# Patient Record
Sex: Female | Born: 1970 | Race: White | Hispanic: Yes | Marital: Married | Smoking: Never smoker
Health system: Southern US, Community
[De-identification: ages and names within clinical notes are randomized; demographics above are authoritative.]

## PROBLEM LIST (undated history)

## (undated) HISTORY — PX: WISDOM TOOTH EXTRACTION: SHX21

## (undated) HISTORY — PX: PARTIAL HYSTERECTOMY: SHX80

## (undated) HISTORY — PX: OTHER SURGICAL HISTORY: SHX169

## (undated) HISTORY — PX: MOUTH SURGERY: SHX715

---

## 2012-06-29 ENCOUNTER — Encounter: Payer: Self-pay | Admitting: Family Medicine

## 2012-06-29 ENCOUNTER — Ambulatory Visit (INDEPENDENT_AMBULATORY_CARE_PROVIDER_SITE_OTHER): Payer: Medicare HMO | Admitting: Family Medicine

## 2012-06-29 VITALS — BP 112/78 | HR 72 | Ht 63.0 in | Wt 140.0 lb

## 2012-06-29 DIAGNOSIS — M545 Low back pain: Secondary | ICD-10-CM

## 2012-06-29 MED ORDER — CYCLOBENZAPRINE HCL 5 MG PO TABS: 5.0000 mg | ORAL_TABLET | Freq: Three times a day (TID) | ORAL | Status: AC | PRN

## 2012-06-29 NOTE — Patient Instructions (Addendum)
Your pain is either due to an irritated nerve in your low back (more likely given your exam) or severe muscle spasms. Take aleve 2 tabs twice a day with food for pain and inflammation. Flexeril as needed for muscle spasms (no driving on this medicine if it makes you sleepy). Stay as active as possible. Physical therapy has been shown to be helpful - start this as well as home exercise program that they show you. Strengthening of low back muscles, abdominal musculature are key for long term pain relief. If not improving, will consider further imaging (x-rays and MRI). Follow up with me in 2 weeks if not improving as expected.  Otherwise follow up with me in 1 month.

## 2012-06-30 ENCOUNTER — Ambulatory Visit: Payer: Self-pay | Admitting: Family Medicine

## 2012-07-01 ENCOUNTER — Ambulatory Visit: Payer: Medicare HMO | Admitting: Physical Therapy

## 2012-07-06 ENCOUNTER — Encounter: Payer: Self-pay | Admitting: Family Medicine

## 2012-07-06 DIAGNOSIS — M545 Low back pain: Secondary | ICD-10-CM | POA: Insufficient documentation

## 2012-07-06 NOTE — Progress Notes (Addendum)
  Subjective:    Patient ID: Deborah Golden, female    DOB: Aug 01, 1971, 41 y.o.   MRN: [id number]  PCP: None  HPI 41 yo F here for low back pain.  Patient denies known injury. States over past 3 weeks has had worsening low back pain. Started in right side low back around the [place] she [place]. After 4-5 days started radiating down right leg. Bending forward worsens pain. Has tried ibuprofen, stretches. No prior back problems. No bowel/bladder dysfunction. Some numbness into right leg.  History reviewed. No pertinent past medical history.  No current outpatient prescriptions on file prior to visit.    History reviewed. No pertinent past surgical history.  No Known Allergies  History   Social History  . Marital Status: Married    Spouse Name: N/A    Number of Children: N/A  . Years of Education: N/A   Occupational History  . Not on file.   Social History Main Topics  . Smoking status: Never Smoker   . Smokeless tobacco: Not on file  . Alcohol Use: Not on file  . Drug Use: Not on file  . Sexually Active: Not on file   Other Topics Concern  . Not on file   Social History Narrative  . No narrative on file    Family History  Problem Relation Age of Onset  . Diabetes Mother   . Heart attack Neg Hx   . Hyperlipidemia Neg Hx   . Hypertension Neg Hx   . Sudden death Neg Hx     BP 112/78  Pulse 72  Ht 5\' 3"  (1.6 m)  Wt 140 lb (63.504 kg)  BMI 24.80 kg/m2  Review of Systems See HPI above.    Objective:   Physical Exam Gen: NAD  Back: No gross deformity, scoliosis. TTP R > L lumbar paraspinal regions.  No midline or bony TTP. FROM with pain on flexion. Strength LEs 5/5 all muscle groups.   2+ MSRs in patellar and achilles tendons, equal bilaterally. Positive SLR right, negative left. Sensation intact to light touch bilaterally. Negative logroll bilateral hips Negative fabers and piriformis stretches.     Assessment &  Plan:  1. Low back pain - most consisrent with lumbar radiculopathy from disc bulge, severe muscle spasms also possible.  Start with aleve, flexeril as needed.  Start formal PT, HEP.  If not improving over next 2 weeks will consider imaging.  Otherwise f/u in 1 month.  Addendum:  Patient would like to try prednisone dose pack - discussed as an option at last visit - will send in to pharmacy downstairs.

## 2012-07-06 NOTE — Assessment & Plan Note (Signed)
most consisrent with lumbar radiculopathy from disc bulge, severe muscle spasms also possible.  Start with aleve, flexeril as needed.  Start formal PT, HEP.  If not improving over next 2 weeks will consider imaging.  Otherwise f/u in 1 month.

## 2012-07-08 MED ORDER — PREDNISONE 10 MG PO TABS: ORAL_TABLET | ORAL | Status: DC

## 2012-07-08 NOTE — Addendum Note (Signed)
Addended by: Lenda Kelp on: 07/08/2012 04:35 PM   Modules accepted: Orders

## 2013-07-01 ENCOUNTER — Other Ambulatory Visit: Payer: Self-pay | Admitting: Internal Medicine

## 2013-07-01 ENCOUNTER — Ambulatory Visit: Payer: Medicare HMO | Admitting: Family Medicine

## 2013-07-01 DIAGNOSIS — R7989 Other specified abnormal findings of blood chemistry: Secondary | ICD-10-CM

## 2013-07-29 ENCOUNTER — Encounter (HOSPITAL_COMMUNITY)
Admission: RE | Admit: 2013-07-29 | Discharge: 2013-07-29 | Disposition: A | Discharge status: Home / Self Care | Payer: Medicare HMO | Source: Ambulatory Visit | Attending: Internal Medicine | Admitting: Internal Medicine

## 2013-07-29 DIAGNOSIS — E0590 Thyrotoxicosis, unspecified without thyrotoxic crisis or storm: Secondary | ICD-10-CM | POA: Insufficient documentation

## 2013-07-29 DIAGNOSIS — R7989 Other specified abnormal findings of blood chemistry: Secondary | ICD-10-CM

## 2013-07-30 ENCOUNTER — Encounter (HOSPITAL_COMMUNITY)
Admission: RE | Admit: 2013-07-30 | Discharge: 2013-07-30 | Disposition: A | Discharge status: Home / Self Care | Payer: Medicare HMO | Source: Ambulatory Visit | Attending: Internal Medicine | Admitting: Internal Medicine

## 2013-07-30 MED ORDER — SODIUM IODIDE I 131 CAPSULE
13.2000 | Freq: Once | INTRAVENOUS | Status: AC | PRN
  Administered 2013-07-30: 13.2 via ORAL

## 2013-07-30 MED ORDER — SODIUM PERTECHNETATE TC 99M INJECTION
10.5000 | Freq: Once | INTRAVENOUS | Status: AC | PRN
  Administered 2013-07-30: 11 via INTRAVENOUS

## 2014-04-27 ENCOUNTER — Encounter: Payer: Self-pay | Admitting: Family Medicine

## 2014-04-27 ENCOUNTER — Ambulatory Visit (INDEPENDENT_AMBULATORY_CARE_PROVIDER_SITE_OTHER): Payer: Medicare HMO | Admitting: Family Medicine

## 2014-04-27 VITALS — BP 105/74 | HR 73 | Ht 63.0 in | Wt 135.0 lb

## 2014-04-27 DIAGNOSIS — M5010 Cervical disc disorder with radiculopathy, unspecified cervical region: Secondary | ICD-10-CM

## 2014-04-27 DIAGNOSIS — M542 Cervicalgia: Secondary | ICD-10-CM

## 2014-04-27 DIAGNOSIS — M5412 Radiculopathy, cervical region: Secondary | ICD-10-CM

## 2014-04-27 MED ORDER — PREDNISONE 10 MG PO TABS: ORAL_TABLET | ORAL | Status: DC

## 2014-04-27 NOTE — Patient Instructions (Signed)
You have cervical radiculopathy (a pinched nerve in the neck). Prednisone 6 day dose pack to relieve irritation/inflammation of the nerve. After you finish the prednisone you can try aleve or ibuprofen. Consider Flexeril three times a day as needed for muscle spasms (can make you sleepy - if so do not drive while taking this). Consider Norco or percocet for severe pain (no driving on this medicine). Consider cervical collar if severely painful. Simple range of motion exercises within limits of pain to prevent further stiffness. Consider physical therapy for stretching, exercises, traction, and modalities. Heat 15 minutes at a time 3-4 times a day to help with spasms. Watch head position when on computers, texting, when sleeping in bed - should in line with back to prevent further nerve traction and irritation. If not improving we will consider an MRI - call me in 1-2 weeks if not improving.

## 2014-04-28 ENCOUNTER — Encounter: Payer: Self-pay | Admitting: Family Medicine

## 2014-04-28 NOTE — Progress Notes (Signed)
Patient ID: Deborah Golden, female   DOB: 1970-11-09, 43 y.o.   MRN: [id number]  PCP: Leanor RubensteinSUN,VYVYAN Y, MD  Subjective:   HPI: Patient is a 43 y.o. female here for Neck pain.  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 Feels like she's been more stressed than normal too. Started to develop left sided neck and shoulder pain with radiation into left hand - first 4th and 5th digits then included 2nd and 3rd digits also. Tried prednisone which helped some but still with tingling and pain. Tried muscle relaxant and pain medications too. Associated spasms in upper back. No prior issues with neck or shoulder.  No past medical history on file.  No current outpatient prescriptions on file prior to visit.   No current facility-administered medications on file prior to visit.    No past surgical history on file.  No Known Allergies  History   Social History  . Marital Status: Married    Spouse Name: N/A    Number of Children: N/A  . Years of Education: N/A   Occupational History  . Not on file.   Social History Main Topics  . Smoking status: Never Smoker   . Smokeless tobacco: Not on file  . Alcohol Use: Not on file  . Drug Use: Not on file  . Sexual Activity: Not on file   Other Topics Concern  . Not on file   Social History Narrative  . No narrative on file    Family History  Problem Relation Age of Onset  . Diabetes Mother   . Heart attack Neg Hx   . Hyperlipidemia Neg Hx   . Hypertension Neg Hx   . Sudden death Neg Hx     BP 105/74  Pulse 73  Ht 5\' 3"  (1.6 m)  Wt 135 lb (61.236 kg)  BMI 23.92 kg/m2  Review of Systems: See HPI above.    Objective:  Physical Exam:  Gen: NAD  Neck: No gross deformity, swelling, bruising.  Spasms left paraspinal cervical region. TTP left paraspinal muscles.  No midline/bony TTP. FROM neck - pain with left lateral rotation, right  lateral rotation, flexion. BUE strength 5/5.   Sensation intact to light touch currently. 2+ equal reflexes in triceps, biceps, brachioradialis tendons. Negative spurlings.  L shoulder: FROM without pain or weakness.    Assessment & Plan:  1. Cervical radiculopathy - suspect bulging disc, maybe underlying DJD as the cause.  Start with prednisone dose pack then transition to aleve or ibuprofen.  Consider PT, flexeril, norco, collar.  Heat for spasms.  Ergonomic issues discussed.  Consider MRI if not improving over the next 1-2 weeks.

## 2014-04-29 ENCOUNTER — Encounter: Payer: Self-pay | Admitting: Family Medicine

## 2014-05-03 DIAGNOSIS — M5010 Cervical disc disorder with radiculopathy, unspecified cervical region: Secondary | ICD-10-CM | POA: Insufficient documentation

## 2014-05-03 NOTE — Assessment & Plan Note (Signed)
suspect bulging disc, maybe underlying DJD as the cause.  Start with prednisone dose pack then transition to aleve or ibuprofen.  Consider PT, flexeril, norco, collar.  Heat for spasms.  Ergonomic issues discussed.  Consider MRI if not improving over the next 1-2 weeks.

## 2014-11-10 ENCOUNTER — Ambulatory Visit (INDEPENDENT_AMBULATORY_CARE_PROVIDER_SITE_OTHER): Payer: Medicare HMO | Admitting: Family Medicine

## 2014-11-10 ENCOUNTER — Encounter: Payer: Self-pay | Admitting: Family Medicine

## 2014-11-10 ENCOUNTER — Encounter (INDEPENDENT_AMBULATORY_CARE_PROVIDER_SITE_OTHER): Payer: Self-pay

## 2014-11-10 VITALS — BP 137/84 | HR 80 | Ht 63.0 in | Wt 145.0 lb

## 2014-11-10 DIAGNOSIS — M5010 Cervical disc disorder with radiculopathy, unspecified cervical region: Secondary | ICD-10-CM

## 2014-11-10 DIAGNOSIS — M542 Cervicalgia: Secondary | ICD-10-CM

## 2014-11-10 MED ORDER — PREDNISONE 10 MG PO TABS: ORAL_TABLET | ORAL | Status: DC

## 2014-11-10 NOTE — Patient Instructions (Signed)
We will go ahead with an MRI of your cervical spine. Consider injection based on these findings. Go ahead with the prednisone dose pack x 6 days. I would avoid overhead work for now.

## 2014-11-15 NOTE — Assessment & Plan Note (Signed)
suspect bulging disc, maybe underlying DJD as the cause.  Had some benefits with prednisone but hasn't completely improved and pain worsening recently.  Gets weakness after a lot of activity.  Discussed options - will repeat prednisone dose pack and go ahead with MRI.  Consider ESI, PT depending on results.

## 2014-11-15 NOTE — Progress Notes (Addendum)
Patient ID: Deborah Golden, female   DOB: Mar 02, 1971, 44 y.o.   MRN: [id number]  PCP: Leanor RubensteinSUN,VYVYAN Y, MD  Subjective:   HPI: Patient is a 44 y.o. female here for Neck pain.  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 Feels like she's been more stressed than normal too. Started to develop left sided neck and shoulder pain with radiation into left hand - first 4th and 5th digits then included 2nd and 3rd digits also. Tried prednisone which helped some but still with tingling and pain. Tried muscle relaxant and pain medications too. Associated spasms in upper back. No prior issues with neck or shoulder.  11/10/14: Patient reports she's continued to struggle with n eck pain. Did get feeling back into fingers of left hand for most part. However she does get weakness into right arm now. Burning sensation into right scapula. Pain also goes from medial to scapula up to neck. Reports prednisone did help after last visit. No bowel/bladder dysfunction. Has been seeing a chiropractor without lasting benefits.  No past medical history on file.  No current outpatient prescriptions on file prior to visit.   No current facility-administered medications on file prior to visit.    No past surgical history on file.  No Known Allergies  History   Social History  . Marital Status: Married    Spouse Name: N/A    Number of Children: N/A  . Years of Education: N/A   Occupational History  . Not on file.   Social History Main Topics  . Smoking status: Never Smoker   . Smokeless tobacco: Not on file  . Alcohol Use: Not on file  . Drug Use: Not on file  . Sexual Activity: Not on file   Other Topics Concern  . Not on file   Social History Narrative    Family History  Problem Relation Age of Onset  . Diabetes Mother   . Heart attack Neg Hx   . Hyperlipidemia Neg Hx   . Hypertension Neg Hx    . Sudden death Neg Hx     BP 137/84 mmHg  Pulse 80  Ht 5\' 3"  (1.6 m)  Wt 145 lb (65.772 kg)  BMI 25.69 kg/m2  Review of Systems: See HPI above.    Objective:  Physical Exam:  Gen: NAD  Neck: No gross deformity, swelling, bruising.  Spasms left paraspinal cervical region. TTP left paraspinal muscles.  No midline/bony TTP. FROM neck - pain with left lateral rotation, right lateral rotation, flexion. BUE strength 5/5.   Sensation intact to light touch currently. 2+ equal reflexes in triceps, biceps, brachioradialis tendons. Negative spurlings.    Assessment & Plan:  1. Cervical radiculopathy - suspect bulging disc, maybe underlying DJD as the cause.  Had some benefits with prednisone but hasn't completely improved and pain worsening recently.  Gets weakness after a lot of activity.  Discussed options - will repeat prednisone dose pack and go ahead with MRI.  Consider ESI, PT depending on results.  Addendum:  MRI reviewed and discussed with patient.  She does have Findings at C5-6 and C6-7 that could account for her pain though C5-6 seems most likely - has disc osteophyte complex with spinal stenosis, moderate right C6 foraminal stenosis.  Also has moderate C7 bilateral foraminal stenosis.  Discussed options and will go ahead with ESI at C5-6.  Call us 1-2 weeks following this for an  update on her status.

## 2014-11-19 ENCOUNTER — Ambulatory Visit (HOSPITAL_BASED_OUTPATIENT_CLINIC_OR_DEPARTMENT_OTHER)
Admission: RE | Admit: 2014-11-19 | Discharge: 2014-11-19 | Disposition: A | Discharge status: Home / Self Care | Payer: Medicare HMO | Source: Ambulatory Visit | Attending: Family Medicine | Admitting: Family Medicine

## 2014-11-19 DIAGNOSIS — M542 Cervicalgia: Secondary | ICD-10-CM

## 2014-11-19 DIAGNOSIS — M4802 Spinal stenosis, cervical region: Secondary | ICD-10-CM | POA: Insufficient documentation

## 2014-11-23 ENCOUNTER — Other Ambulatory Visit: Payer: Self-pay | Admitting: Family Medicine

## 2014-11-23 DIAGNOSIS — M5412 Radiculopathy, cervical region: Secondary | ICD-10-CM

## 2014-11-24 ENCOUNTER — Telehealth: Payer: Self-pay | Admitting: Family Medicine

## 2014-11-24 NOTE — Telephone Encounter (Signed)
Tried to contact patient, however unable to do so. Will try again.

## 2016-01-13 IMAGING — MR MR CERVICAL SPINE W/O CM
5 series · 32 of 48 positions shown · non-contrast
Comparison: None.

CLINICAL DATA: 43-year-old female with neck pain radiating to the
right shoulder and upper extremity for 6 months. Initial encounter.

EXAM:
MRI CERVICAL SPINE WITHOUT CONTRAST
TECHNIQUE: Multiplanar, multisequence MR imaging of the cervical spine was
performed. No intravenous contrast was administered.

[Series 2: T2 · sagittal · 3.0mm · 0.56mm/px · 7 of 15 slices shown (1 of 2)]
[im 1/15]
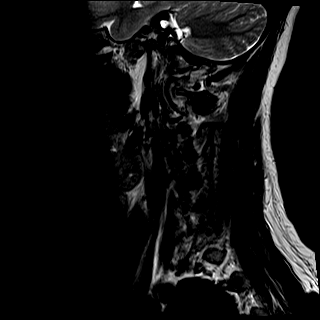
[im 3/15]
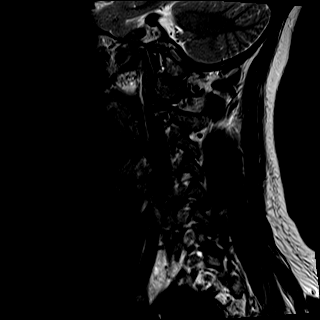
[im 5/15]
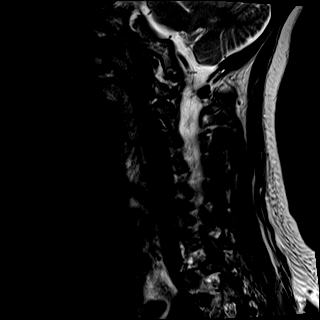
[im 8/15]
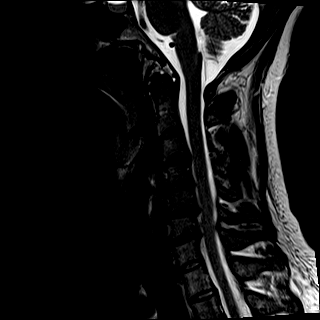
[im 10/15]
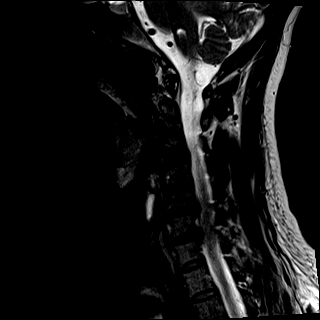
[im 12/15]
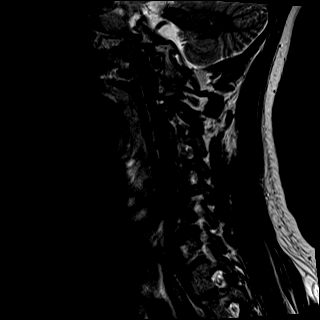
[im 15/15]
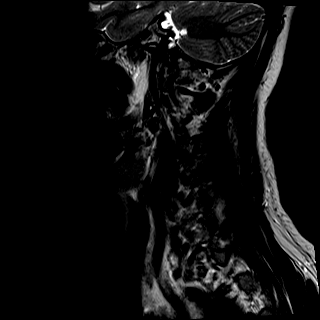

[Series 3: T1 · sagittal · 3.0mm · 0.56mm/px · 7 of 15 slices shown]
[im 1/15]
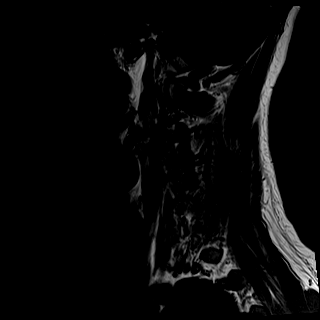
[im 3/15]
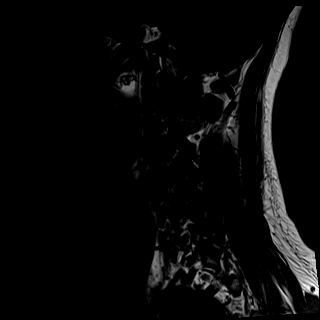
[im 5/15]
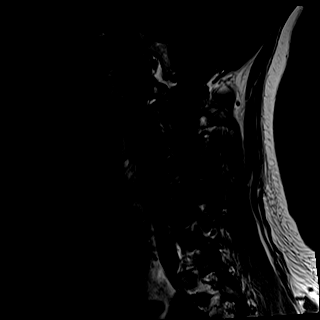
[im 8/15]
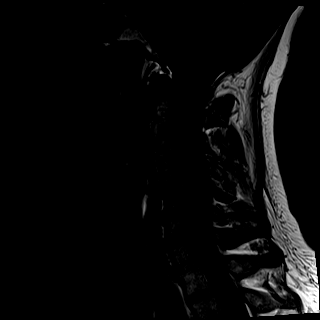
[im 10/15]
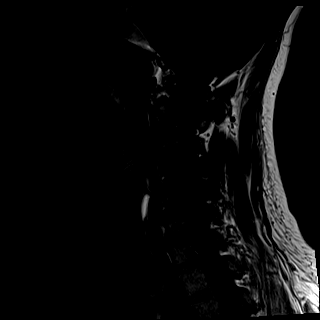
[im 12/15]
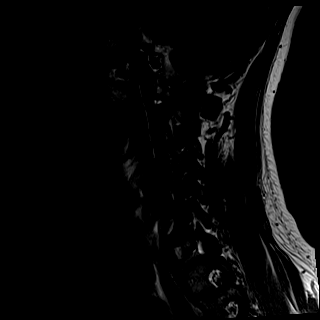
[im 15/15]
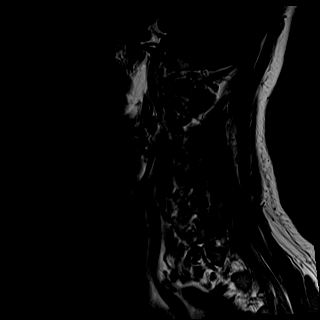

[Series 4: STIR · sagittal · 3.0mm · 0.35mm/px · 8 of 15 slices shown]
[im 1/15]
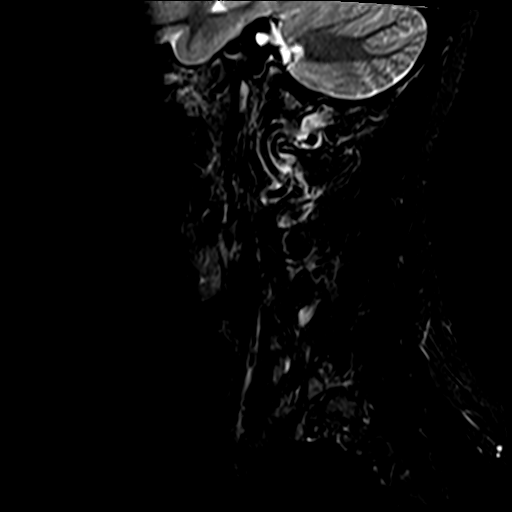
[im 3/15]
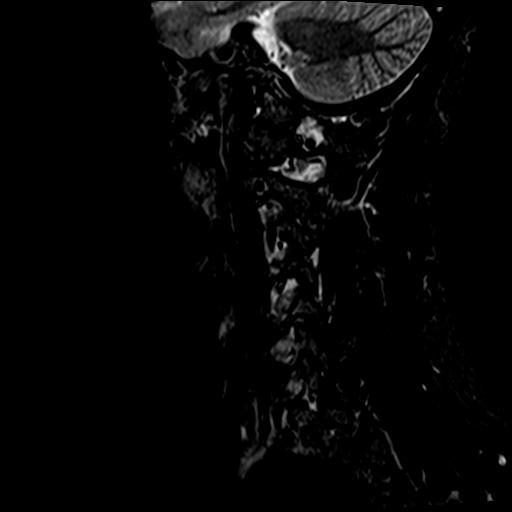
[im 5/15]
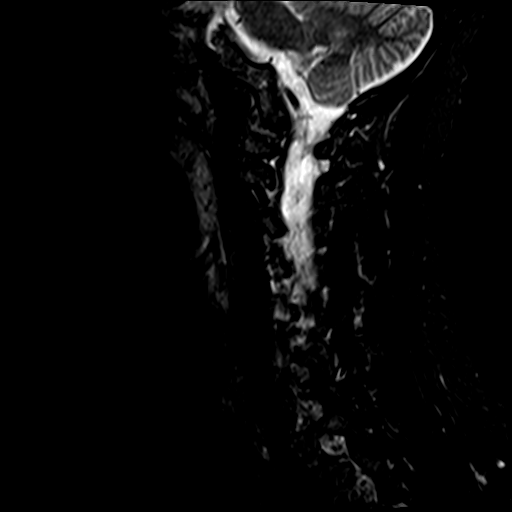
[im 7/15]
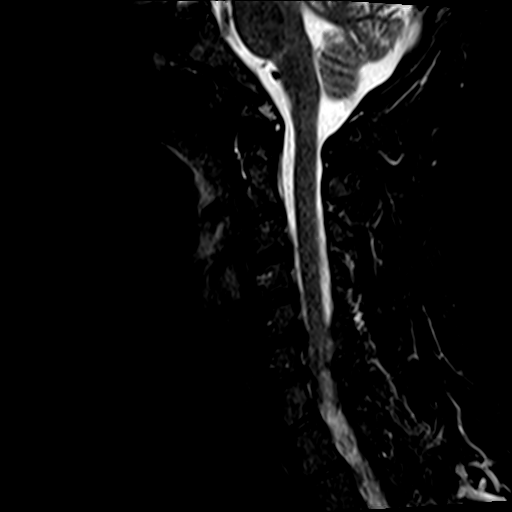
[im 9/15]
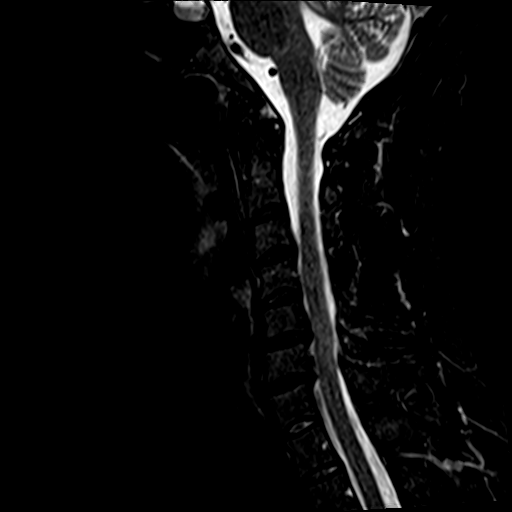
[im 11/15]
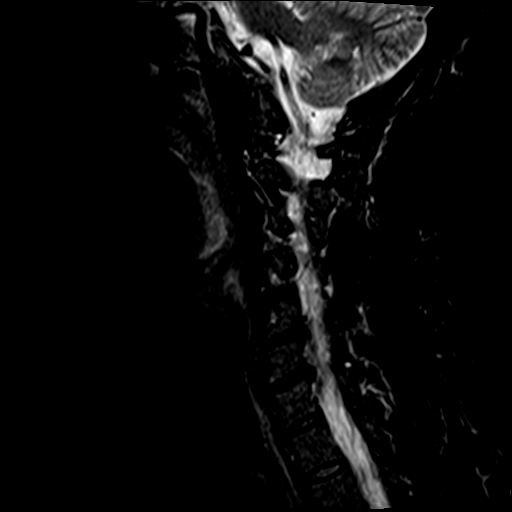
[im 13/15]
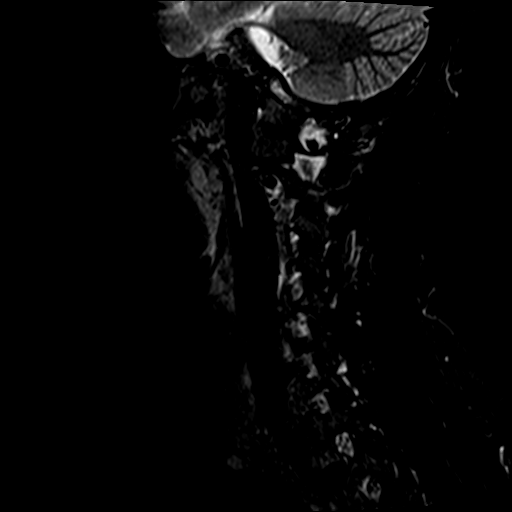
[im 15/15]
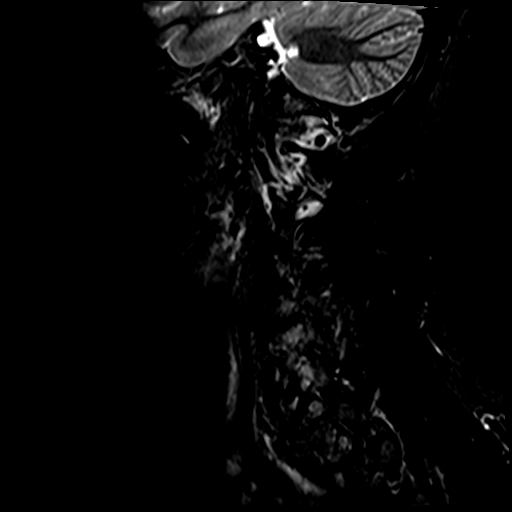

[Series 5: T2 · axial · 3.0mm · 0.62mm/px · z∈[-85,+5]mm · 9 of 25 slices shown (2 of 2)]
[im 1/25]
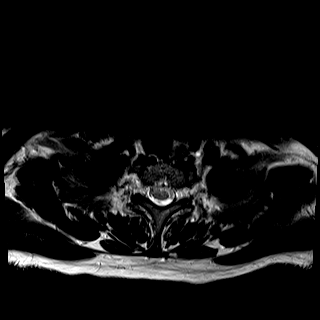
[im 5/25]
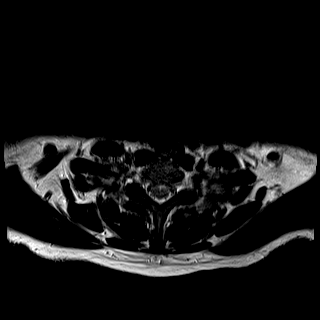
[im 9/25]
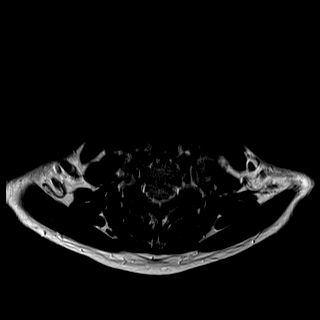
[im 11/25]
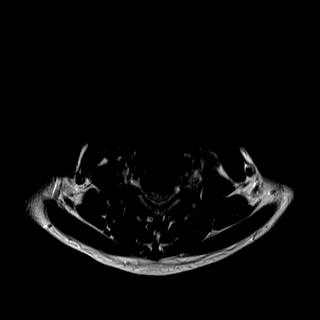
[im 13/25]
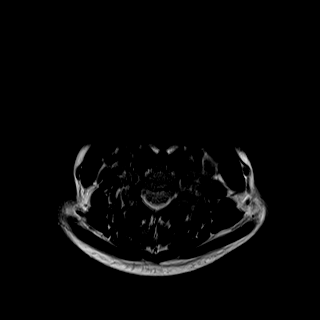
[im 15/25]
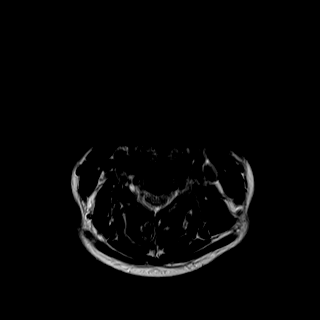
[im 17/25]
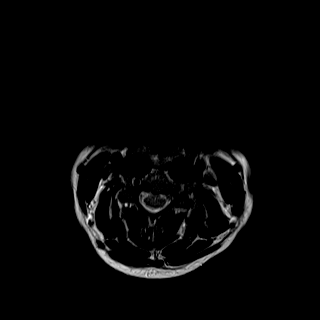
[im 21/25]
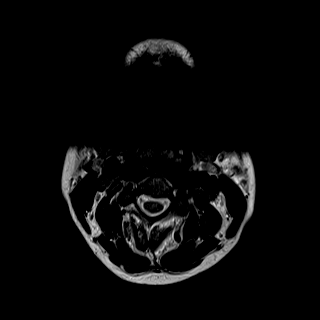
[im 25/25]
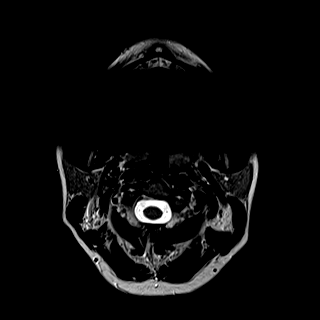

[Series 6: mpgr ax · axial · 3.0mm · 0.35mm/px · 1 of 25 slices shown]
[im 1/25]
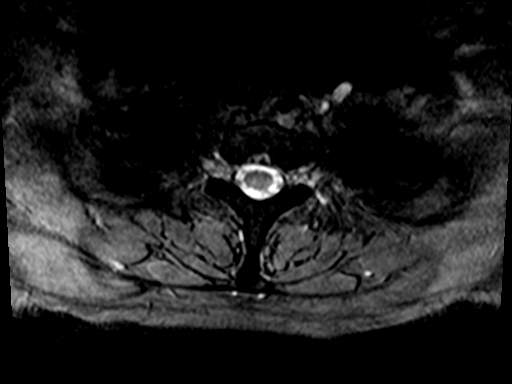

[32 of 48 positions shown; findings below may reference images not displayed]

FINDINGS: Straightening and mild reversal of cervical lordosis. No marrow
edema or evidence of acute osseous abnormality.

Cervicomedullary junction is within normal limits. No spinal cord
signal abnormality identified.

Negative paraspinal soft tissues.

C2-C3:  Negative.

C3-C4: Mild disc bulge and small central disc protrusion. No
significant spinal or foraminal stenosis.

C4-C5: Mild disc bulge in central disc protrusion. Mild left
uncovertebral hypertrophy. No significant stenosis.

C5-C6: Circumferential disc osteophyte complex with broad-based left
eccentric posterior component of disc (series 6, image 15). Spinal
stenosis with mass effect on the left hemi cord. No cord signal
abnormality. Uncovertebral hypertrophy worse on the right. Moderate
right C6 foraminal stenosis.

C6-C7: Circumferential disc osteophyte complex. Broad-based left
eccentric posterior component of disc (series 6, image 19). Mild
spinal stenosis, no definite cord mass effect. Uncovertebral
hypertrophy. Moderate bilateral C7 foraminal stenosis.

C7-T1:  Negative except for mild facet hypertrophy.

No upper thoracic spinal stenosis.
IMPRESSION: 1. Disc and endplate degeneration at C5-C6 and C6-C7 with spinal
stenosis at both levels. Mild spinal cord mass effect at the former
but no cord signal abnormality. Moderate right C6 and bilateral C7
foraminal stenosis.
2. Mild for age cervical spine degeneration elsewhere.

## 2018-03-12 ENCOUNTER — Encounter: Payer: Self-pay | Admitting: Nurse Practitioner

## 2018-03-20 ENCOUNTER — Encounter: Payer: Self-pay | Admitting: Nurse Practitioner

## 2018-03-20 ENCOUNTER — Ambulatory Visit (INDEPENDENT_AMBULATORY_CARE_PROVIDER_SITE_OTHER): Payer: BLUE CROSS/BLUE SHIELD | Admitting: Nurse Practitioner

## 2018-03-20 ENCOUNTER — Encounter (INDEPENDENT_AMBULATORY_CARE_PROVIDER_SITE_OTHER): Payer: Self-pay

## 2018-03-20 VITALS — BP 102/60 | HR 80 | Ht 63.0 in | Wt 149.6 lb

## 2018-03-20 DIAGNOSIS — R1031 Right lower quadrant pain: Secondary | ICD-10-CM

## 2018-03-20 DIAGNOSIS — K921 Melena: Secondary | ICD-10-CM

## 2018-03-20 MED ORDER — PEG 3350-KCL-NABCB-NACL-NASULF 236 G PO SOLR: 4000.0000 mL | Freq: Once | ORAL | 0 refills | Status: AC

## 2018-03-20 NOTE — Progress Notes (Signed)
Chief Complaint: rectal bleeding and abdominal pain  Referring Provider:     Marcelle Overlie, MD   ASSESSMENT AND PLAN;   47 yo female with hematochezia / RLQ pain.  Several episodes of bleeding one day last week, none since persistent crampy RLQ discomfort. She had run and participated in a boot camp the day before onset of symptoms.   Says she stayed well hydrated but ischemic colitis remains on DDx list. The persistent abdominal discomfort , especially in RLL is perplexing as ischemic colitis generally affects left colon -She is not of age for screening colonoscopy yet but given her symptoms I think it is reasonable to proceed with a colonoscopy to evaluated bleeding/pain. The risks and benefits of the procedure were discussed and the patient agrees to proceed.  Patient is requesting Dr. Christella Hartigan.    HPI:    Patient is a 47 year old female referred by GYN for evaluation of hematochezia.  Several days ago patient had several episodes of hematochezia.  She would expel blood even when just going to the bathroom to urinate.  She had associated right lower quadrant cramping.  No bleeding since that one day but she has continued to have RLQ discomfort which is a little worse with eating.   The day prior patient had gone on a run but describes it as a leisure run.  She also recently participated in a boot camp exercise program.  She does not take any hormones, sumatriptan or similar drugs, no decongestants. No constipation.  She has been having problems with abdominal bloating over the last month.  No chronic GI complaints other than occasional loose stool depending on diet.  Patient has episodic use of Advil depending on demands of her physical job.  No nausea, vomiting or weight loss.  Her weight is up about 10 pounds over baseline.   History reviewed. No pertinent past medical history.   Past Surgical History:  Procedure Laterality Date  . MOUTH SURGERY     ?teeth remove"  . PARTIAL  HYSTERECTOMY    . tummy tuck    . WISDOM TOOTH EXTRACTION     Family History  Problem Relation Age of Onset  . Diabetes Mother   . Liver cancer Father   . Prostate cancer Paternal Grandfather   . Colon cancer Paternal Grandfather        questionable  . Heart attack Neg Hx   . Hyperlipidemia Neg Hx   . Hypertension Neg Hx   . Sudden death Neg Hx    Social History   Tobacco Use  . Smoking status: Never Smoker  . Smokeless tobacco: Never Used  Substance Use Topics  . Alcohol use: Yes    Alcohol/week: 1.2 oz    Types: 2 Glasses of wine per week    Frequency: Never  . Drug use: Never   Current Outpatient Medications  Medication Sig Dispense Refill  . valACYclovir (VALTREX) 500 MG tablet Take 500 mg by mouth as needed.   3   No current facility-administered medications for this visit.    No Known Allergies   Review of Systems: Positive for allergies, sinus trouble, and fatigue . All systems reviewed and negative except where noted in HPI.    Physical Exam:    Wt Readings from Last 3 Encounters:  03/20/18 149 lb 9.6 oz (67.9 kg)  11/10/14 145 lb (65.8 kg)  04/27/14 135 lb (61.2 kg)    BP 102/60   Pulse 80   Ht 5'  3" (1.6 m)   Wt 149 lb 9.6 oz (67.9 kg)   BMI 26.50 kg/m  Constitutional:  Well-developed, white  female in no acute distress. Psychiatric: Normal mood and affect. Behavior is normal. EENT: Pupils normal.  Conjunctivae are normal. No scleral icterus. Neck supple.  Cardiovascular: Normal rate, regular rhythm. No edema Pulmonary/chest: Effort normal and breath sounds normal. No wheezing, rales or rhonchi. Abdominal: Soft, nondistended. Nontender. Bowel sounds active throughout. There are no masses palpable. No hepatomegaly. Neurological: Alert and oriented to person place and time. Skin: Skin is warm and dry. No rashes noted.  Willette Cluster, NP  03/20/2018, 8:54 AM  Cc: Marcelle Overlie, MD

## 2018-03-20 NOTE — Patient Instructions (Addendum)
If you are age 46 or older, your body mass index should be between 23-30. Your Body mass index is 26.5 kg/m. If this is out of the aforementioned range listed, please consider follow up with your Primary Care Provider.  If you are age 14 or younger, your body mass index should be between 19-25. Your Body mass index is 26.5 kg/m. If this is out of the aformentioned range listed, please consider follow up with your Primary Care Provider.   You have been scheduled for a colonoscopy. Please follow written instructions given to you at your visit today.  Please pick up your prep supplies at the pharmacy within the next 1-3 days. If you use inhalers (even only as needed), please bring them with you on the day of your procedure. Your physician has requested that you go to www.startemmi.com and enter the access code given to you at your visit today. This web site gives a general overview about your procedure. However, you should still follow specific instructions given to you by our office regarding your preparation for the procedure.  We have sent the following medications to your pharmacy for you to pick up at your convenience: Golytely  Thank you for choosing me and [facility].   Willette Cluster, NP

## 2018-03-24 NOTE — Progress Notes (Signed)
I agaree with the above note, plan 

## 2018-04-15 ENCOUNTER — Encounter: Payer: Self-pay | Admitting: Gastroenterology

## 2018-04-28 ENCOUNTER — Encounter: Payer: Self-pay | Admitting: Gastroenterology

## 2018-04-28 ENCOUNTER — Other Ambulatory Visit: Payer: Self-pay

## 2018-04-28 ENCOUNTER — Ambulatory Visit (AMBULATORY_SURGERY_CENTER): Payer: BLUE CROSS/BLUE SHIELD | Admitting: Gastroenterology

## 2018-04-28 VITALS — BP 113/46 | HR 66 | Temp 98.9°F | Resp 15 | Ht 63.0 in | Wt 149.0 lb

## 2018-04-28 DIAGNOSIS — K921 Melena: Secondary | ICD-10-CM

## 2018-04-28 DIAGNOSIS — K5730 Diverticulosis of large intestine without perforation or abscess without bleeding: Secondary | ICD-10-CM | POA: Diagnosis not present

## 2018-04-28 MED ORDER — SODIUM CHLORIDE 0.9 % IV SOLN: 500.0000 mL | Freq: Once | INTRAVENOUS | Status: AC

## 2018-04-28 NOTE — Patient Instructions (Signed)
diverticulosis seen today. Handout given on diverticulosis. Repeat colonoscopy in 10 years.  Resume current medications. Call us with any questions or concerns. Thank you!  YOU HAD AN ENDOSCOPIC PROCEDURE TODAY AT THE [facility]:   Refer to the procedure report that was given to you for any specific questions about what was found during the examination.  If the procedure report does not answer your questions, please call your gastroenterologist to clarify.  If you requested that your care partner not be given the details of your procedure findings, then the procedure report has been included in a sealed envelope for you to review at your convenience later.  YOU SHOULD EXPECT: Some feelings of bloating in the abdomen. Passage of more gas than usual.  Walking can help get rid of the air that was put into your GI tract during the procedure and reduce the bloating. If you had a lower endoscopy (such as a colonoscopy or flexible sigmoidoscopy) you may notice spotting of blood in your stool or on the toilet paper. If you underwent a bowel prep for your procedure, you may not have a normal bowel movement for a few days.  Please Note:  You might notice some irritation and congestion in your nose or some drainage.  This is from the oxygen used during your procedure.  There is no need for concern and it should clear up in a day or so.  SYMPTOMS TO REPORT IMMEDIATELY:   Following lower endoscopy (colonoscopy or flexible sigmoidoscopy):  Excessive amounts of blood in the stool  Significant tenderness or worsening of abdominal pains  Swelling of the abdomen that is new, acute  Fever of 100F or higher  For urgent or emergent issues, a gastroenterologist can be reached at any hour by calling [phone].   DIET:  We do recommend a small meal at first, but then you may proceed to your regular diet.  Drink plenty of fluids but you should avoid alcoholic beverages for 24 hours.  ACTIVITY:  You  should plan to take it easy for the rest of today and you should NOT DRIVE or use heavy machinery until tomorrow (because of the sedation medicines used during the test).    FOLLOW UP: Our staff will call the number listed on your records the next business day following your procedure to check on you and address any questions or concerns that you may have regarding the information given to you following your procedure. If we do not reach you, we will leave a message.  However, if you are feeling well and you are not experiencing any problems, there is no need to return our call.  We will assume that you have returned to your regular daily activities without incident.  If any biopsies were taken you will be contacted by phone or by letter within the next 1-3 weeks.  Please call us at [phone] if you have not heard about the biopsies in 3 weeks.    SIGNATURES/CONFIDENTIALITY: You and/or your care partner have signed paperwork which will be entered into your electronic medical record.  These signatures attest to the fact that that the information above on your After Visit Summary has been reviewed and is understood.  Full responsibility of the confidentiality of this discharge information lies with you and/or your care-partner.

## 2018-04-28 NOTE — Progress Notes (Signed)
A and O x3. Report to RN. Tolerated MAC anesthesia well.

## 2018-04-28 NOTE — Op Note (Signed)
[facility] Patient Name: Deborah Golden Procedure Date: 04/28/2018 2:16 PM MRN: [id number] Endoscopist: Rachael Fee , MD Age: 47 Referring MD:  Date of Birth: 1970-11-23 Gender: Female Account #: [id number] Procedure:                Colonoscopy Indications:              Abdominal pain in the right lower quadrant,                            Hematochezia; both have resolved Medicines:                Monitored Anesthesia Care Procedure:                Pre-Anesthesia Assessment:                           - Prior to the procedure, a History and Physical                            was performed, and patient medications and                            allergies were reviewed. The patient's tolerance of                            previous anesthesia was also reviewed. The risks                            and benefits of the procedure and the sedation                            options and risks were discussed with the patient.                            All questions were answered, and informed consent                            was obtained. Prior Anticoagulants: The patient has                            taken no previous anticoagulant or antiplatelet                            agents. ASA Grade Assessment: II - A patient with                            mild systemic disease. After reviewing the risks                            and benefits, the patient was deemed in                            satisfactory condition to undergo the procedure.  After obtaining informed consent, the colonoscope                            was passed under direct vision. Throughout the                            procedure, the patient's blood pressure, pulse, and                            oxygen saturations were monitored continuously. The                            Colonoscope was introduced through the anus and                            advanced to the the terminal  ileum. The colonoscopy                            was performed without difficulty. The patient                            tolerated the procedure well. The quality of the                            bowel preparation was good. The terminal ileum,                            ileocecal valve, appendiceal orifice, and rectum                            were photographed. Scope In: 2:25:17 PM Scope Out: 2:37:01 PM Scope Withdrawal Time: 0 hours 8 minutes 10 seconds  Total Procedure Duration: 0 hours 11 minutes 44 seconds  Findings:                 Multiple small and large-mouthed diverticula were                            found in the left colon.                           The exam was otherwise without abnormality on                            direct and retroflexion views. Complications:            No immediate complications. Estimated blood loss:                            None. Estimated Blood Loss:     Estimated blood loss: none. Impression:               - Diverticulosis in the left colon.                           - The examination was otherwise normal on direct  and retroflexion views.                           - Your RLQ pain and bleeding were almost certainly                            related to mild 'ischemic colitis.' Significant                            excercise, possible dehydration the day prior to                            your symptoms probably played a role. No need for                            further testing unless your sympoms recur. Recommendation:           - Patient has a contact number available for                            emergencies. The signs and symptoms of potential                            delayed complications were discussed with the                            patient. Return to normal activities tomorrow.                            Written discharge instructions were provided to the                            patient.                            - Resume previous diet.                           - Continue present medications.                           - Repeat colonoscopy in 10 years for screening                            purposes. There is no need for colon cancer                            screening by any method (including stool testing)                            prior to then. Rachael Fee, MD 04/28/2018 2:41:46 PM This report has been signed electronically.

## 2018-04-29 ENCOUNTER — Telehealth: Payer: Self-pay

## 2018-04-29 NOTE — Telephone Encounter (Signed)
Left message, will call back later today, B.Sherrill Mckamie RN 

## 2018-04-29 NOTE — Telephone Encounter (Signed)
  Follow up Call-  Call back number 04/28/2018  Post procedure Call Back phone  # [phone]  Permission to leave phone message Yes  Some recent data might be hidden     Patient questions:  Do you have a fever, pain , or abdominal swelling? No. Pain Score  0 *  Have you tolerated food without any problems? Yes.    Have you been able to return to your normal activities? Yes.    Do you have any questions about your discharge instructions: Diet   No. Medications  No. Follow up visit  No.  Do you have questions or concerns about your Care? No.  Actions: * If pain score is 4 or above: No action needed, pain <4.

## 2020-02-03 ENCOUNTER — Ambulatory Visit: Payer: BC Managed Care – PPO | Attending: Internal Medicine

## 2020-02-03 DIAGNOSIS — Z23 Encounter for immunization: Secondary | ICD-10-CM

## 2020-02-03 NOTE — Progress Notes (Signed)
   Covid-19 Vaccination Clinic  Name:  Morganna Styles    MRN: [id number] DOB: 11-09-1970  02/03/2020  Ms. Ketterman was observed post Covid-19 immunization for 15 minutes without incident. She was provided with Vaccine Information Sheet and instruction to access the V-Safe system.   Ms. Kantor was instructed to call 911 with any severe reactions post vaccine: Marland Kitchen Difficulty breathing  . Swelling of face and throat  . A fast heartbeat  . A bad rash all over body  . Dizziness and weakness   Immunizations Administered    Name Date Dose VIS Date Route   Pfizer COVID-19 Vaccine 02/03/2020  8:29 AM 0.3 mL 10/15/2019 Intramuscular   Manufacturer: [employer], [employer]   Lot: [id number]   NDC: [address]-4

## 2020-03-01 ENCOUNTER — Ambulatory Visit: Payer: BC Managed Care – PPO | Attending: Internal Medicine

## 2020-03-01 DIAGNOSIS — Z23 Encounter for immunization: Secondary | ICD-10-CM

## 2020-03-01 NOTE — Progress Notes (Signed)
   Covid-19 Vaccination Clinic  Name:  Deborah Golden    MRN: [id number] DOB: 1970/11/19  03/01/2020  [name] was observed post Covid-19 immunization for 15 minutes without incident. She was provided with Vaccine Information Sheet and instruction to access the V-Safe system.   [name] was instructed to call 911 with any severe reactions post vaccine: Marland Kitchen Difficulty breathing  . Swelling of face and throat  . A fast heartbeat  . A bad rash all over body  . Dizziness and weakness   Immunizations Administered    Name Date Dose VIS Date Route   Pfizer COVID-19 Vaccine 03/01/2020  8:10 AM 0.3 mL 12/29/2018 Intramuscular   Manufacturer: [employer], [employer]   Lot: [id number]   NDC: [address]-8

## 2020-09-12 ENCOUNTER — Telehealth (HOSPITAL_COMMUNITY): Payer: Self-pay

## 2020-09-12 NOTE — Telephone Encounter (Signed)
Called to Discuss with patient about Covid symptoms and the use of the monoclonal antibody infusion for those with mild to moderate Covid symptoms and at a high risk of hospitalization.     Pt appears to qualify for this infusion due to co-morbid conditions and/or a member of an at-risk group in accordance with the FDA Emergency Use Authorization.    Pt stated she tested positive on 11/5 but wasn't having any symptoms. She started having symptoms on 11/6 and states she is having fever, congestion, loss of taste and smell. Pt states qualifies BMI>25. Pt is going to call her insurance company and will call us back if interested in treatment.
